# Patient Record
Sex: Female | Born: 1985 | Race: Black or African American | Hispanic: No | Marital: Single | Smoking: Never smoker
Health system: Southern US, Community
[De-identification: ages and names within clinical notes are randomized; demographics above are authoritative.]

## PROBLEM LIST (undated history)

## (undated) DIAGNOSIS — M069 Rheumatoid arthritis, unspecified: Secondary | ICD-10-CM

## (undated) HISTORY — DX: Rheumatoid arthritis, unspecified: M06.9

---

## 2014-09-10 DIAGNOSIS — M25532 Pain in left wrist: Secondary | ICD-10-CM | POA: Diagnosis present

## 2014-09-10 DIAGNOSIS — M779 Enthesopathy, unspecified: Secondary | ICD-10-CM | POA: Insufficient documentation

## 2014-09-11 ENCOUNTER — Encounter (HOSPITAL_BASED_OUTPATIENT_CLINIC_OR_DEPARTMENT_OTHER): Payer: Self-pay

## 2014-09-11 ENCOUNTER — Emergency Department (HOSPITAL_BASED_OUTPATIENT_CLINIC_OR_DEPARTMENT_OTHER)
Admission: EM | Admit: 2014-09-11 | Discharge: 2014-09-11 | Disposition: A | Discharge status: Home / Self Care | Payer: BC Managed Care – PPO | Attending: Emergency Medicine | Admitting: Emergency Medicine

## 2014-09-11 ENCOUNTER — Emergency Department (HOSPITAL_BASED_OUTPATIENT_CLINIC_OR_DEPARTMENT_OTHER): Payer: BC Managed Care – PPO

## 2014-09-11 DIAGNOSIS — M778 Other enthesopathies, not elsewhere classified: Secondary | ICD-10-CM

## 2014-09-11 DIAGNOSIS — R52 Pain, unspecified: Secondary | ICD-10-CM

## 2014-09-11 MED ORDER — NAPROXEN SODIUM 220 MG PO TABS: ORAL_TABLET | ORAL | Status: AC

## 2014-09-11 NOTE — ED Notes (Signed)
Pt refused splint states will buy one at retail store.

## 2014-09-11 NOTE — ED Provider Notes (Signed)
CSN: [id number]     Arrival date & time 09/10/14  2352 History   First MD Initiated Contact with Patient 09/11/14 [phone]     Chief Complaint  Patient presents with  . Wrist Pain     (Consider location/radiation/quality/duration/timing/severity/associated sxs/prior Treatment) HPI  This is a 28 year old female with a history of tendinitis in her elbows. She developed what she described as excruciating, throbbing pain all over the distal ulna of the left wrist. The pain extended proximally and distally. Pain was worse with extension at the wrist or with palpation. There was associated erythema. She took Aleve with significant relief and her pain is minimal at the present time.  History reviewed. No pertinent past medical history. History reviewed. No pertinent past surgical history. History reviewed. No pertinent family history. History  Substance Use Topics  . Smoking status: Never Smoker   . Smokeless tobacco: Not on file  . Alcohol Use: Yes     Comment: weekly   OB History    No data available     Review of Systems  All other systems reviewed and are negative.   Allergies  Review of patient's allergies indicates no known allergies.  Home Medications   Prior to Admission medications   Not on File   BP 137/78 mmHg  Pulse 82  Temp(Src) 98.3 F (36.8 C) (Oral)  Resp 16  Ht 5\' 5"  (1.651 m)  Wt 215 lb (97.523 kg)  BMI 35.78 kg/m2  SpO2 99%  LMP 08/22/2014   Physical Exam  General: Well-developed, well-nourished female in no acute distress; appearance consistent with age of record HENT: normocephalic; atraumatic Eyes: Normal appearance Neck: supple Heart: regular rate and rhythm Lungs: Normal respiratory effort and excursion Abdomen: soft; nondistended Extremities: No deformity; full range of motion; mild tenderness over the left extensor carpi ulnaris tendon without significant erythema or warmth Neurologic: Awake, alert and oriented; motor function intact in all  extremities and symmetric; no facial droop Skin: Warm and dry Psychiatric: Normal mood and affect    ED Course  Procedures (including critical care time)    MDM  Nursing notes and vitals signs, including pulse oximetry, reviewed.  Summary of this visit's results, reviewed by myself:  Imaging Studies: Dg Wrist Complete Left  09/11/2014   CLINICAL DATA:  LEFT lateral wrist pain, no known injury.  EXAM: LEFT WRIST - COMPLETE 3+ VIEW  COMPARISON:  None.  FINDINGS: There is no evidence of fracture or dislocation. There is no evidence of arthropathy or other focal bone abnormality. Soft tissues are unremarkable.  IMPRESSION: Negative.   Electronically Signed   By: Awilda Metroourtnay  Bloomer   On: 09/11/2014 01:02       Carlisle BeersJohn L Nicolae Vasek, MD 09/11/14 [id number]

## 2014-09-11 NOTE — ED Notes (Signed)
Pt reports left wrist pain, edema, redness - denies known injury.

## 2016-02-24 IMAGING — CR DG WRIST COMPLETE 3+V*L*
4 series · 4 of 4 positions shown · non-contrast
Comparison: None.

CLINICAL DATA: LEFT lateral wrist pain, no known injury.

EXAM:
LEFT WRIST - COMPLETE 3+ VIEW

[x wrist pa left]
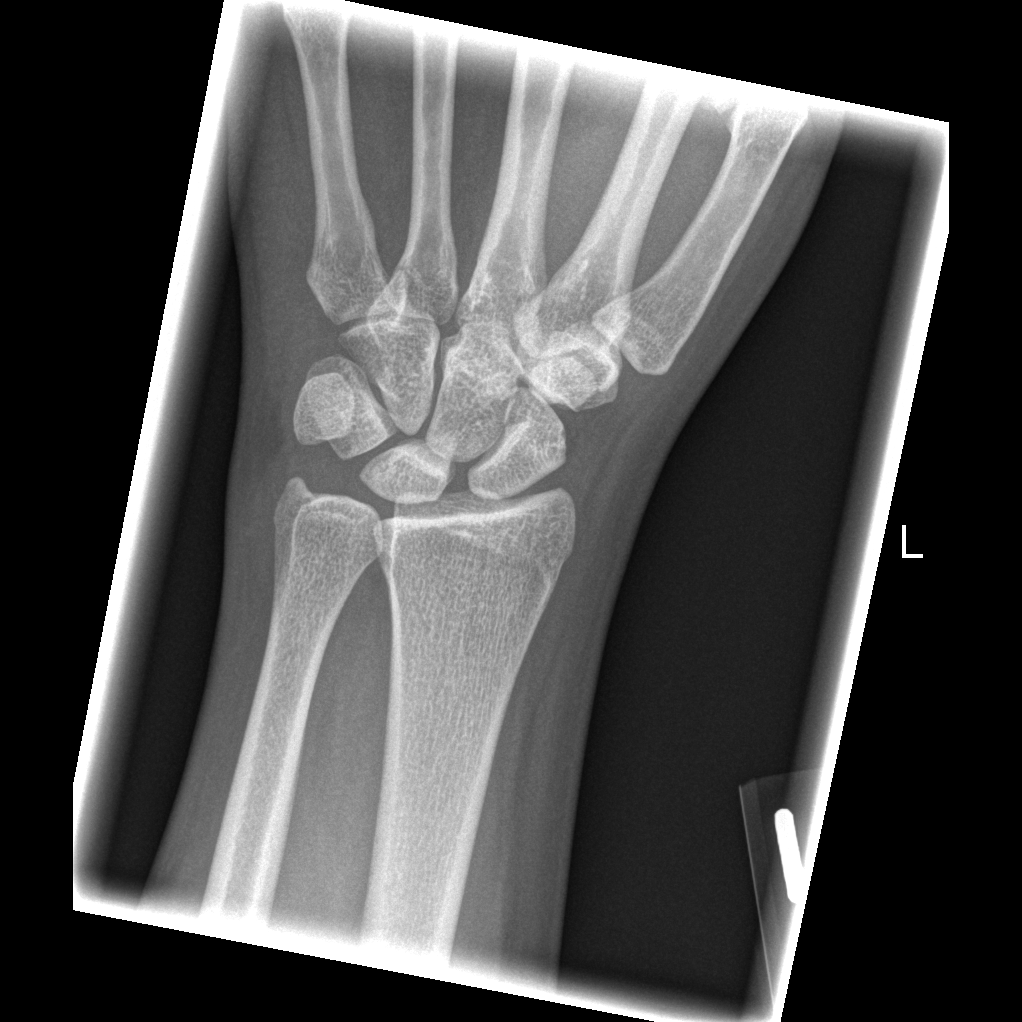

[x wrist obl left]
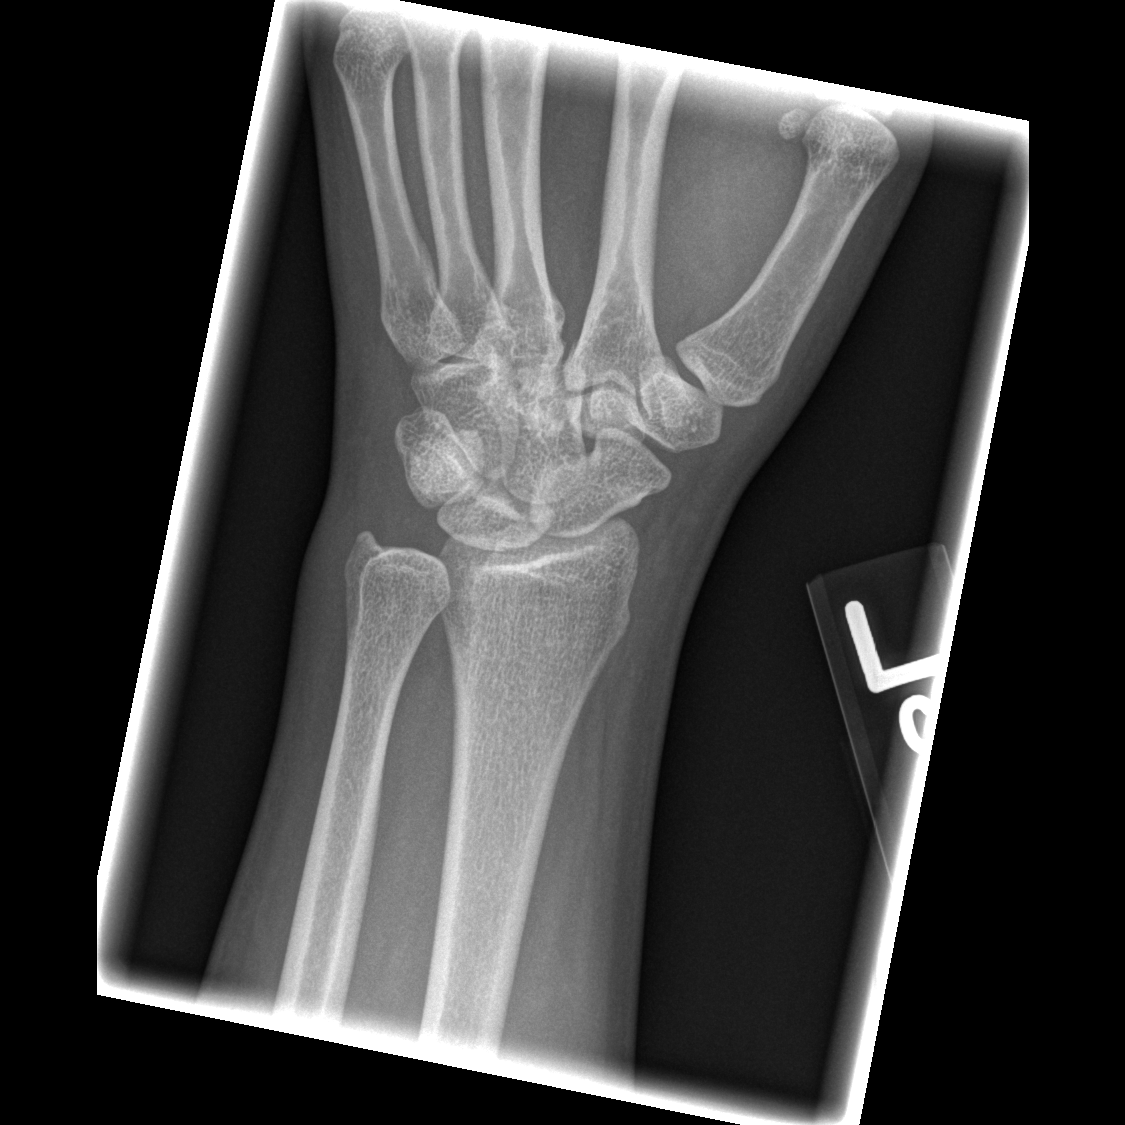

[x wrist lat left]
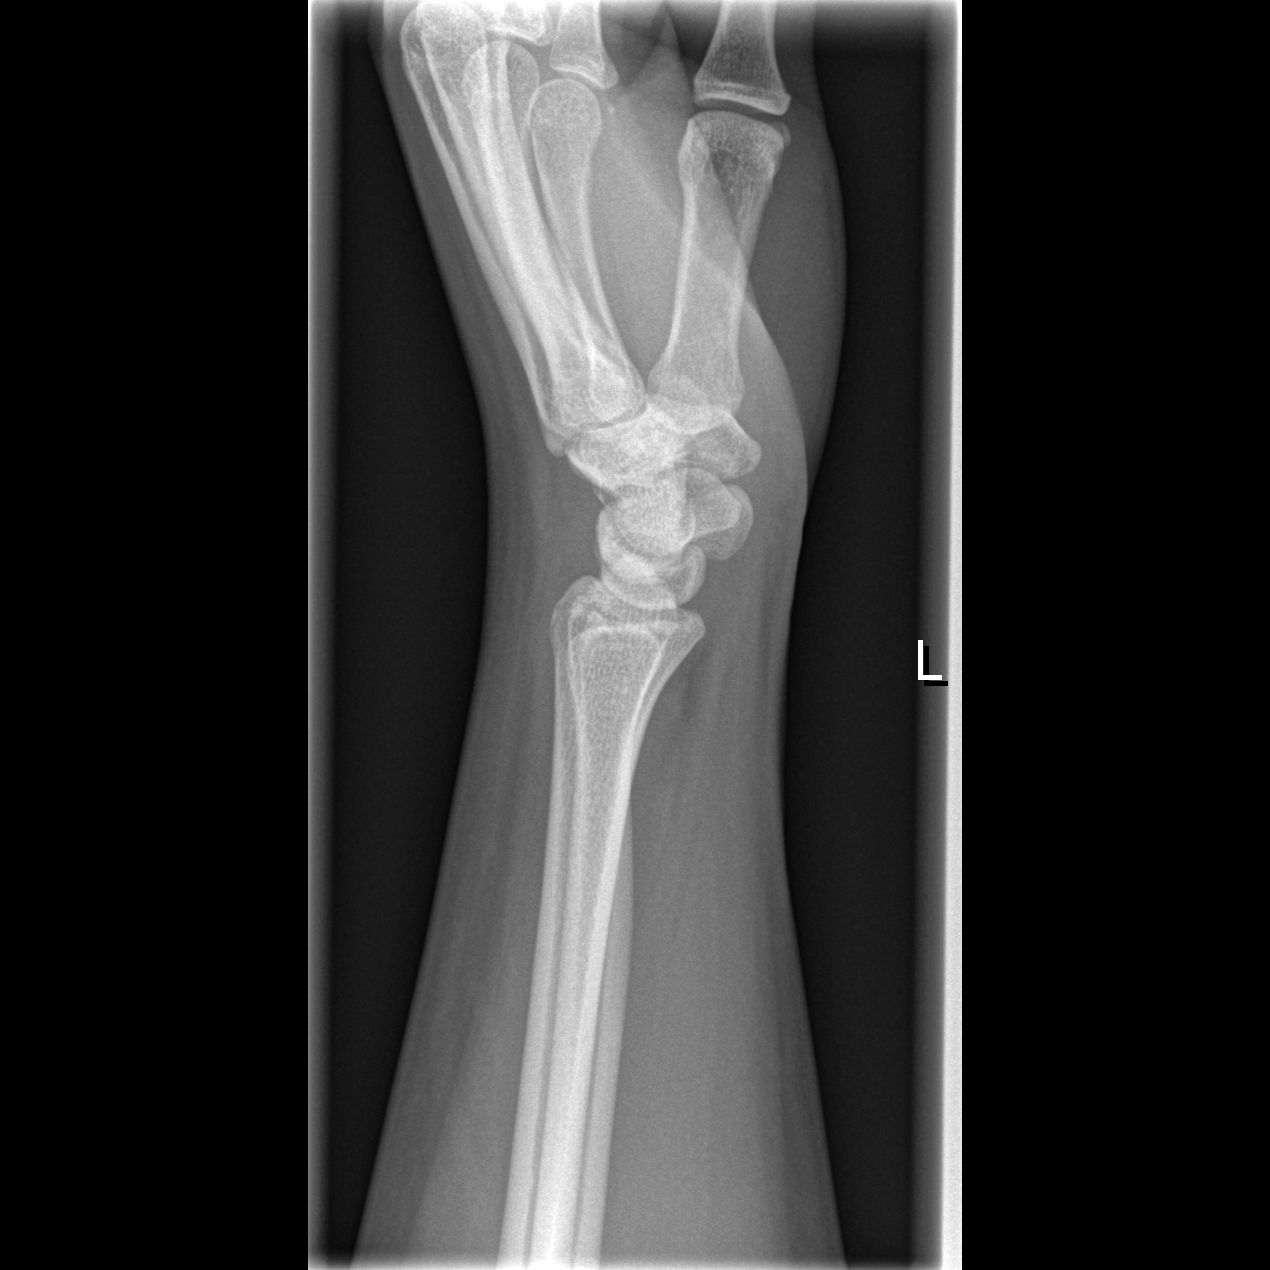

[x navicular]
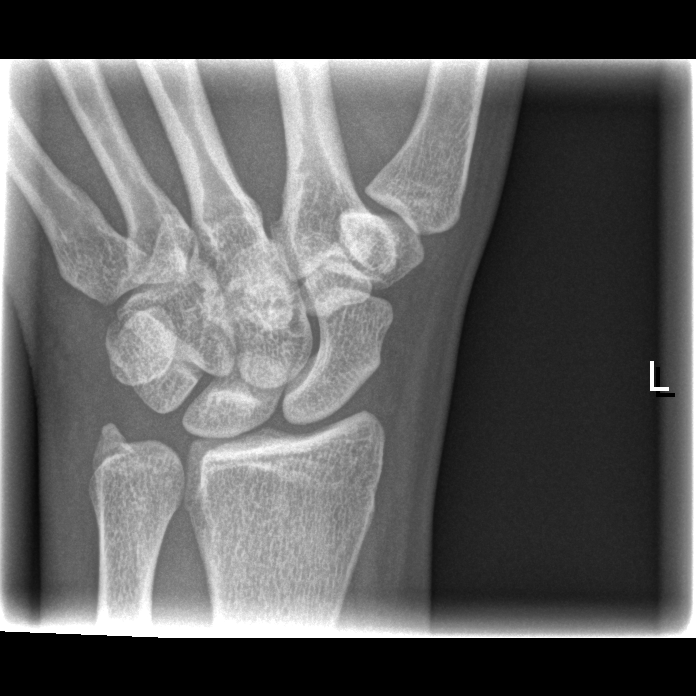

[4 of 4 positions shown; findings below may reference images not displayed]

FINDINGS: There is no evidence of fracture or dislocation. There is no
evidence of arthropathy or other focal bone abnormality. Soft
tissues are unremarkable.
IMPRESSION: Negative.

  By: Nya Jumper

## 2018-01-14 ENCOUNTER — Other Ambulatory Visit (INDEPENDENT_AMBULATORY_CARE_PROVIDER_SITE_OTHER): Payer: Self-pay | Admitting: Internal Medicine

## 2018-01-15 LAB — COMPREHENSIVE METABOLIC PANEL
ALT: 12 U/L (ref 10–50)
AST (SGOT): 20 U/L (ref 0–40)
African American eGFR: 113.67
Albumin: 4.3 g/dL (ref 3.5–5.2)
Alkaline Phosphatase: 56 U/L (ref 35–130)
BUN / Creatinine Ratio: 13
BUN: 9 mg/dL (ref 6–23)
Bilirubin, Total: 0.4 mg/dL (ref 0.0–1.2)
CO2: 23 mmol/L (ref 19–31)
Calcium: 10.1 mg/dL (ref 8.6–10.2)
Chloride: 103 mEq/l (ref 97–107)
Creatinine: 0.71 mg/dL (ref 0.50–1.00)
Glucose: 97 mg/dL (ref 74–106)
Potassium: 3.8 mmol/L (ref 3.5–5.1)
Protein, Total: 7.67 g/dL (ref 6.30–8.70)
Sodium: 138 mmol/L (ref 135–145)
non-African American eGFR: 93.94

## 2018-01-15 LAB — LIPID PANEL
Cholesterol / HDL Ratio: 5 — ABNORMAL HIGH (ref 0.0–3.6)
Cholesterol: 206 mg/dL — ABNORMAL HIGH (ref 0–200)
HDL: 41 mg/dL (ref >40)
LDL Calculated: 123 mg/dL (ref 0–129)
NON HDL CHOLESTEROL: 164 mg/dL
Triglycerides: 209 mg/dL — ABNORMAL HIGH (ref 0–150)

## 2018-01-15 LAB — HEMOGLOBIN A1C: Hemoglobin A1C: 5.1 g/dL (ref 3.8–5.7)

## 2019-05-26 ENCOUNTER — Telehealth (INDEPENDENT_AMBULATORY_CARE_PROVIDER_SITE_OTHER): Payer: BC Managed Care – PPO | Admitting: Family

## 2019-05-26 ENCOUNTER — Encounter (INDEPENDENT_AMBULATORY_CARE_PROVIDER_SITE_OTHER): Payer: Self-pay | Admitting: Family

## 2019-05-26 DIAGNOSIS — L678 Other hair color and hair shaft abnormalities: Secondary | ICD-10-CM | POA: Insufficient documentation

## 2019-05-26 DIAGNOSIS — R1011 Right upper quadrant pain: Secondary | ICD-10-CM

## 2019-05-26 DIAGNOSIS — M545 Low back pain: Secondary | ICD-10-CM

## 2019-05-26 DIAGNOSIS — D573 Sickle-cell trait: Secondary | ICD-10-CM | POA: Insufficient documentation

## 2019-05-26 DIAGNOSIS — D649 Anemia, unspecified: Secondary | ICD-10-CM | POA: Insufficient documentation

## 2019-05-26 DIAGNOSIS — F419 Anxiety disorder, unspecified: Secondary | ICD-10-CM | POA: Insufficient documentation

## 2019-05-26 DIAGNOSIS — F32A Depression, unspecified: Secondary | ICD-10-CM | POA: Insufficient documentation

## 2019-05-26 DIAGNOSIS — M069 Rheumatoid arthritis, unspecified: Secondary | ICD-10-CM | POA: Insufficient documentation

## 2019-05-26 DIAGNOSIS — R197 Diarrhea, unspecified: Secondary | ICD-10-CM

## 2019-05-26 DIAGNOSIS — E559 Vitamin D deficiency, unspecified: Secondary | ICD-10-CM | POA: Insufficient documentation

## 2019-05-26 DIAGNOSIS — Z6838 Body mass index (BMI) 38.0-38.9, adult: Secondary | ICD-10-CM | POA: Insufficient documentation

## 2019-05-26 NOTE — Patient Instructions (Signed)
Follow up with the Emergency Department for further evaluation and treatment.    Unknown Causes of Abdominal Pain(Female)    The exact cause of your belly (abdominal) pain is not clear. This does not mean that this is something to worry about. Everyone likes to know the exact cause of the problem. But sometimes with belly pain, there is no clear-cut cause, and this could be a good thing. The good news is that your symptoms can be treated, and you will feel better.  Your condition does not seem serious now. But sometimes the signs of a serious problem may take more time to appear. For this reason,it is important for you to watch for any new symptoms, problems,or worsening of your condition.  Over the next few days, the abdominal pain may come and go. Or it may be constant. Other common symptoms can include nausea and vomiting. Sometimes it can be difficult to tell if you feel nauseous. You may just feel bad and not connect that feeling to nausea. Constipation, diarrhea, and a fever may go along with the pain.  The pain may continue even if treated correctly over the following days. Depending on how things go, sometimes the cause can become clear and may need moreor different treatment. Additional evaluations, medicines, or tests may also be needed.  Home care  Your healthcare provider may prescribe medicine for pain, symptoms, or an infection. Follow the healthcare provider's instructions for taking these medicines.  General care   Rest as much as you can until your next exam. No strenuous activities.   Try to find positions that ease discomfort. A small pillow placed on the abdomen may help relieve pain.   Something warm on your abdomen (such as a heating pad) may help, but be careful not to burn yourself.  Diet   Don'tforce yourself to eat, especially if having cramps, vomiting, or diarrhea.   Water is important so you don't get dehydrated. Soup may also be good. Sports drinks may also help, especially  if they are not too acidic. Don't drink sugary drinks as this can make things worse. Take liquids in small amounts. Don'tguzzle them.   Caffeine sometimes makes the pain and cramping worse.   Don't takedairy products if you have vomiting or diarrhea.   Don't eat large amounts at a time. Wait a few minutes between bites.   Eat a diet low in fiber (called a low-residue diet). Foods allowed include refined breads, white rice, fruit and vegetable juices without pulp, tender meats. These foods will pass more easily through the intestine.   Don't havewhole-grain foods, whole fruits and vegetables, meats, seeds and nuts, fried or fatty foods, dairy, alcohol and spicy foods until your symptoms go away.  Follow-up care  Follow up with your healthcare provider, or as advised, if your pain does not begin to improve in the next 24 hours.  Call 911  [phone] if any of these occur:   Trouble breathing   Confusion   Fainting or loss of consciousness   Rapid heart rate   Seizure  When to seek medical advice  Call your healthcare provider right away if any of these occur:   Pain gets worse or moves to the right lower abdomen   New or worsening vomiting or diarrhea   Swelling of the abdomen   Unable to pass stool for more than3 days   Fever of 100.96F (38C) or higher, or as directed by your healthcare provider.   Blood in vomit  or bowel movements (dark red or black color)   Yellow color of eyes and skin (jaundice)   Weakness, dizziness   Chest, arm, back, neck, or jaw pain   Unexpected vaginal bleeding or missed period   Can't keep down liquids or water and you are getting dehydrated  [employer] last reviewed this educational content on 02/14/2017   2000-2020 [employer], [place]. [address] [place], [place] [address]. All rights reserved. This information is not intended as a substitute for professional medical care. Always follow your healthcare professional's instructions.

## 2019-05-26 NOTE — Progress Notes (Signed)
[facility]  PROGRESS NOTE     Patient: Kristina Mcdaniel   Date: 05/26/2019   MRN: [id number]     This visit is being conducted via video and telephone.  Verbal consent has been obtained from the patient to conduct a video and telephone visit to minimize exposure to COVID-19: yes    Antonio Letson is a 33 y.o. female       HISTORY     Chief Complaint   Patient presents with   . Abdominal Pain        33 YO F with h/o RA c/o RUQ pain x 2 days, worse after eating any food.        History provided by:  Patient  Language interpreter used: No    Abdominal Pain   This is a new problem. The current episode started in the past 7 days. The onset quality is gradual. The problem occurs intermittently. The problem has been waxing and waning. The pain is located in the RUQ. The pain is at a severity of 4/10. The pain is moderate. The quality of the pain is aching. The abdominal pain radiates to the epigastric region and back. Associated symptoms include diarrhea. Pertinent negatives include no anorexia, arthralgias, belching, dysuria, fever, flatus, frequency, headaches, hematochezia, hematuria, melena, myalgias, nausea, vomiting or weight loss. The pain is aggravated by eating. The pain is relieved by nothing. She has tried nothing for the symptoms. The treatment provided no relief.       Review of Systems   Constitutional: Negative for appetite change, chills, diaphoresis, fatigue, fever and weight loss.   HENT: Negative for congestion and sore throat.    Respiratory: Negative for cough and shortness of breath.    Cardiovascular: Negative for chest pain.   Gastrointestinal: Positive for abdominal pain and diarrhea. Negative for anorexia, flatus, hematochezia, melena, nausea, rectal pain and vomiting.   Genitourinary: Negative for dysuria, frequency, hematuria and urgency.   Musculoskeletal: Positive for back pain. Negative for arthralgias and myalgias.   Skin: Negative for rash.   Neurological: Negative for dizziness,  light-headedness and headaches.   Hematological: Negative for adenopathy.       History:  Past Medical History:   Diagnosis Date   . Rheumatoid arthritis        History reviewed. No pertinent surgical history.    History reviewed. No pertinent family history.    Social History     Socioeconomic History   . Marital status: Single     Spouse name: Not on file   . Number of children: Not on file   . Years of education: Not on file   . Highest education level: Not on file   Occupational History   . Not on file   Social Needs   . Financial resource strain: Not on file   . Food insecurity     Worry: Not on file     Inability: Not on file   . Transportation needs     Medical: Not on file     Non-medical: Not on file   Tobacco Use   . Smoking status: Never Smoker   . Smokeless tobacco: Never Used   Substance and Sexual Activity   . Alcohol use: Not on file   . Drug use: Not on file   . Sexual activity: Not on file   Lifestyle   . Physical activity     Days per week: Not on file     Minutes per  session: Not on file   . Stress: Not on file   Relationships   . Social Wellsite geologist on phone: Not on file     Gets together: Not on file     Attends religious service: Not on file     Active member of club or organization: Not on file     Attends meetings of clubs or organizations: Not on file     Relationship status: Not on file   . Intimate partner violence     Fear of current or ex partner: Not on file     Emotionally abused: Not on file     Physically abused: Not on file     Forced sexual activity: Not on file   Other Topics Concern   . Not on file   Social History Narrative   . Not on file       History reviewed.        Current Outpatient Medications:   .  vitamin D (CHOLECALCIFEROL) 25 MCG (1000 UT) tablet, Take 1,000 Units by mouth daily, Disp: , Rfl:   .  folic acid (FOLVITE) 1 MG tablet, , Disp: , Rfl:   .  Humira Pen 40 MG/0.4ML Pen-injector Kit, , Disp: , Rfl:   .  methotrexate 2.5 MG tablet, , Disp: , Rfl:      Allergies   Allergen Reactions   . Pollen Extract Itching       Schwenk, Alessandra Bevels, MD      PHYSICAL EXAM     There were no vitals filed for this visit.    Physical Exam  Constitutional:       General: She is not in acute distress.     Appearance: Normal appearance. She is obese. She is not ill-appearing, toxic-appearing or diaphoretic.   HENT:      Head: Normocephalic.      Right Ear: External ear normal.      Left Ear: External ear normal.      Nose: Nose normal.      Mouth/Throat:      Comments: Voice is clear  Eyes:      General: No scleral icterus.        Right eye: No discharge.         Left eye: No discharge.      Conjunctiva/sclera: Conjunctivae normal.   Pulmonary:      Effort: Pulmonary effort is normal. No respiratory distress.   Skin:     Coloration: Skin is not pale.      Findings: No erythema or rash.   Neurological:      Mental Status: She is alert and oriented to person, place, and time.   Psychiatric:         Mood and Affect: Mood normal.         Behavior: Behavior normal.           MEDICAL DECISION MAKING     DDX:  Cholecystitis, Pancreatitis, Gastroenteritis, Hepatitis    LABS     Results     ** No results found for the last 24 hours. **          ASSESSMENT     Encounter Diagnosis   Name Primary?   . Right upper quadrant abdominal pain Yes          ASSESSMENT    PLAN      Patient will go to ED for further evaluation.  Either [facility] or  [facility]    Discussed diagnosis and treatment with patient.  Reviewed warning signs for worsening condition as well as indications for follow-up with pmd, return to the urgent care center or emergency department.  She expressed understanding of instructions.    Patient was offered the alternative of an in person visits and they declined.  No      No orders of the defined types were placed in this encounter.        An After Visit Summary was emailed to the to the patient or parent.  Yes    Time spent in discussion: 15 minutes        Signed,  Rometta Emery, FNP

## 2020-02-07 ENCOUNTER — Other Ambulatory Visit (INDEPENDENT_AMBULATORY_CARE_PROVIDER_SITE_OTHER): Payer: Self-pay | Admitting: Internal Medicine
